# Patient Record
Sex: Male | Born: 1985 | Race: Black or African American | Hispanic: No | Marital: Married | Smoking: Current some day smoker
Health system: Southern US, Community
[De-identification: ages and names within clinical notes are randomized; demographics above are authoritative.]

## PROBLEM LIST (undated history)

## (undated) DIAGNOSIS — J45909 Unspecified asthma, uncomplicated: Secondary | ICD-10-CM

---

## 2006-01-23 ENCOUNTER — Emergency Department (HOSPITAL_COMMUNITY): Admission: EM | Admit: 2006-01-23 | Discharge: 2006-01-23 | Payer: Self-pay | Admitting: Emergency Medicine

## 2008-01-16 HISTORY — PX: WISDOM TOOTH EXTRACTION: SHX21

## 2009-02-08 ENCOUNTER — Emergency Department (HOSPITAL_COMMUNITY): Admission: EM | Admit: 2009-02-08 | Discharge: 2009-02-08 | Payer: Self-pay | Admitting: Emergency Medicine

## 2014-03-15 ENCOUNTER — Ambulatory Visit: Payer: Self-pay

## 2014-08-15 ENCOUNTER — Emergency Department (HOSPITAL_COMMUNITY): Payer: Self-pay

## 2014-08-15 ENCOUNTER — Emergency Department (HOSPITAL_COMMUNITY)
Admission: EM | Admit: 2014-08-15 | Discharge: 2014-08-15 | Discharge status: Against Medical Advice | Payer: Self-pay | Attending: Emergency Medicine | Admitting: Emergency Medicine

## 2014-08-15 ENCOUNTER — Encounter (HOSPITAL_COMMUNITY): Payer: Self-pay | Admitting: *Deleted

## 2014-08-15 DIAGNOSIS — R42 Dizziness and giddiness: Secondary | ICD-10-CM | POA: Insufficient documentation

## 2014-08-15 DIAGNOSIS — R0602 Shortness of breath: Secondary | ICD-10-CM | POA: Insufficient documentation

## 2014-08-15 DIAGNOSIS — Z720 Tobacco use: Secondary | ICD-10-CM | POA: Insufficient documentation

## 2014-08-15 DIAGNOSIS — R079 Chest pain, unspecified: Secondary | ICD-10-CM | POA: Insufficient documentation

## 2014-08-15 LAB — I-STAT TROPONIN, ED: Troponin i, poc: 0.01 ng/mL (ref 0.00–0.08)

## 2014-08-15 LAB — BASIC METABOLIC PANEL
Anion gap: 10 (ref 5–15)
BUN: 5 mg/dL — ABNORMAL LOW (ref 6–20)
CO2: 25 mmol/L (ref 22–32)
CREATININE: 0.77 mg/dL (ref 0.61–1.24)
Calcium: 9.1 mg/dL (ref 8.9–10.3)
Chloride: 103 mmol/L (ref 101–111)
GFR calc Af Amer: >60 mL/min (ref >60)
GFR calc non Af Amer: >60 mL/min (ref >60)
GLUCOSE: 97 mg/dL (ref 65–99)
Potassium: 3.3 mmol/L — ABNORMAL LOW (ref 3.5–5.1)
SODIUM: 138 mmol/L (ref 135–145)

## 2014-08-15 LAB — CBC
HCT: 43.9 % (ref 39.0–52.0)
Hemoglobin: 15.5 g/dL (ref 13.0–17.0)
MCH: 33.2 pg (ref 26.0–34.0)
MCHC: 35.3 g/dL (ref 30.0–36.0)
MCV: 94 fL (ref 78.0–100.0)
PLATELETS: 283 10*3/uL (ref 150–400)
RBC: 4.67 MIL/uL (ref 4.22–5.81)
RDW: 13.7 % (ref 11.5–15.5)
WBC: 16.1 10*3/uL — AB (ref 4.0–10.5)

## 2014-08-15 NOTE — ED Notes (Signed)
Pt called in lobby x 3. Pt not in lobby.
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
## 2014-09-03 ENCOUNTER — Emergency Department (HOSPITAL_COMMUNITY): Payer: No Typology Code available for payment source

## 2014-09-03 ENCOUNTER — Encounter (HOSPITAL_COMMUNITY): Payer: Self-pay

## 2014-09-03 ENCOUNTER — Emergency Department (HOSPITAL_COMMUNITY)
Admission: EM | Admit: 2014-09-03 | Discharge: 2014-09-03 | Disposition: A | Discharge status: Home / Self Care | Payer: No Typology Code available for payment source | Attending: Emergency Medicine | Admitting: Emergency Medicine

## 2014-09-03 DIAGNOSIS — Y998 Other external cause status: Secondary | ICD-10-CM | POA: Insufficient documentation

## 2014-09-03 DIAGNOSIS — S6991XA Unspecified injury of right wrist, hand and finger(s), initial encounter: Secondary | ICD-10-CM | POA: Insufficient documentation

## 2014-09-03 DIAGNOSIS — M25531 Pain in right wrist: Secondary | ICD-10-CM

## 2014-09-03 DIAGNOSIS — Z720 Tobacco use: Secondary | ICD-10-CM | POA: Insufficient documentation

## 2014-09-03 DIAGNOSIS — S022XXA Fracture of nasal bones, initial encounter for closed fracture: Secondary | ICD-10-CM

## 2014-09-03 DIAGNOSIS — S8991XA Unspecified injury of right lower leg, initial encounter: Secondary | ICD-10-CM | POA: Insufficient documentation

## 2014-09-03 DIAGNOSIS — Y92410 Unspecified street and highway as the place of occurrence of the external cause: Secondary | ICD-10-CM | POA: Insufficient documentation

## 2014-09-03 DIAGNOSIS — J45909 Unspecified asthma, uncomplicated: Secondary | ICD-10-CM | POA: Insufficient documentation

## 2014-09-03 DIAGNOSIS — Y9389 Activity, other specified: Secondary | ICD-10-CM | POA: Insufficient documentation

## 2014-09-03 HISTORY — DX: Unspecified asthma, uncomplicated: J45.909

## 2014-09-03 MED ORDER — METHOCARBAMOL 500 MG PO TABS: 1000.0000 mg | ORAL_TABLET | Freq: Four times a day (QID) | ORAL | Status: DC | PRN

## 2014-09-03 MED ORDER — NAPROXEN 250 MG PO TABS: 250.0000 mg | ORAL_TABLET | Freq: Two times a day (BID) | ORAL | Status: DC | PRN

## 2014-09-03 MED ORDER — HYDROCODONE-ACETAMINOPHEN 5-325 MG PO TABS: ORAL_TABLET | ORAL | Status: DC

## 2014-09-03 NOTE — Discharge Instructions (Signed)
°Emergency Department Resource Guide °1) Find a Doctor and Pay Out of Pocket °Although you won't have to find out who is covered by your insurance plan, it is a good idea to ask around and get recommendations. You will then need to call the office and see if the doctor you have chosen will accept you as a new patient and what types of options they offer for patients who are self-pay. Some doctors offer discounts or will set up payment plans for their patients who do not have insurance, but you will need to ask so you aren't surprised when you get to your appointment. ° °2) Contact Your Local Health Department °Not all health departments have doctors that can see patients for sick visits, but many do, so it is worth a call to see if yours does. If you don't know where your local health department is, you can check in your phone book. The CDC also has a tool to help you locate your state's health department, and many state websites also have listings of all of their local health departments. ° °3) Find a Walk-in Clinic °If your illness is not likely to be very severe or complicated, you may want to try a walk in clinic. These are popping up all over the country in pharmacies, drugstores, and shopping centers. They're usually staffed by nurse practitioners or physician assistants that have been trained to treat common illnesses and complaints. They're usually fairly quick and inexpensive. However, if you have serious medical issues or chronic medical problems, these are probably not your best option. ° °No Primary Care Doctor: °- Call [facility] at  832-8000 - they can help you locate a primary care doctor that  accepts your insurance, provides certain services, etc. °- Physician Referral Service- 1-800-533-3463 ° °Chronic Pain Problems: °Organization         Address  Phone   Notes  °[facility]  (336) 297-2271 Patients need to be referred by their primary care doctor.  ° °Medication  Assistance: °Organization         Address  Phone   Notes  °[place] [facility] [address] °[place], [place] [address] (336) 641-8030 --Must be a resident of [place] °-- Must have NO insurance coverage whatsoever (no Medicaid/ Medicare, etc.) °-- The pt. MUST have a primary care doctor that directs their care regularly and follows them in the community °  °[facility]  (866) 331-1348   °[employer]  (888) 892-1162   ° °Agencies that provide inexpensive medical care: °Organization         Address  Phone   Notes  °[facility]  (336) 832-8035   °[facility]    (336) 832-7272   °[facility] [address] °[place], [place] [address] (336) 832-4777   °[facility] [address], °[place] (336) 271-4999   °[facility]    (336) 373-0678   °[facility]    (336) 272-1050   °[facility] ° [address], [place] Phone:  (336) 832-4444, Fax:  (336) 832-4440 Hours of Operation:  9 am - 6 pm, M-F.  Also accepts Medicaid/Medicare and self-pay.  °[facility] ° [address], [place] Phone: (336) 832-3150, Fax: (336) 832-3151. Hours of Operation:  8:30 am - 5:30 pm, M-F.  Also accepts Medicaid and self-pay.  °[facility] [place] [address], [place] Phone: (336) 878-6027   °[facility] [address], [place], [place] (336)723-1848, Ext. 123 Mondays & Thursdays: 7-9 AM.  First 15 patients are seen on a first come, first serve basis. °  ° °Medicaid-accepting [place] Providers: ° °Organization         Address  Phone   Notes  °[facility] [address], [place] (336) 641-2100 Also accepts self-pay patients.  °[facility] [address], [place] ° (336) 856-9996   °[facility] [address], [place]  (336) 288-8857   °[facility] [address] [place] [address], [place] (336) 299-7000   °Veita Bland [address], [place]  ° (336) 373-1557 Only accepts [place] Access Medicaid patients after they have their name applied to their card.  ° °Self-Pay (no insurance) in [place]: ° °Organization         Address  Phone   Notes  °Sickle Cell Patients, [facility] [address], [place] (336) 832-1970   °[facility] [address], [place] (336) 832-4400   °[facility] ° [address], [place] (336) 992-4800   °[facility]/Dr. Osei-Bonsu ° [address] [place] [address], [place] or [address], [place] (336) 841-8500 Phone number for both [place] and [place] locations is the same.  °[facility] [address], [place] (336) 299-0000   °[facility] [address] [place] [address], [place] or [address] (336) 852-7530 °(336) 878-2260   °[facility] [address], [place] (336) 350-1642, phone; (336) 294-5005, fax Sees patients 1st and 3rd Saturday of every month.  Must not qualify for public or private insurance (i.e. Medicaid, Medicare, [place] Health Choice, Veterans' Benefits) • Household income should be no more than 200% of the poverty level •The clinic cannot treat you if you are pregnant or think you are pregnant • Sexually transmitted diseases are not treated at the clinic.  ° ° °Dental Care: °Organization         Address  Phone  Notes  °[place] [facility] [address], [place] (336) 641-6152 Accepts children up to age 21 who are enrolled in Medicaid or [place] Health Choice; pregnant women with a Medicaid card; and children who have applied for Medicaid or [place] Health Choice, but were declined, whose parents can pay a reduced fee at time of service.  °[place]  [facility] [place]  [address], [place] (336) 641-7733 Accepts children up to age 21 who are enrolled in Medicaid or [place] Health Choice; pregnant women with a Medicaid card; and children who have applied for Medicaid or [place] Health Choice, but were declined, whose parents can pay a reduced fee at time of service.  °[facility] ° [address], [place] (336) 641-4533 Patients are seen by appointment only. Walk-ins are not accepted. [facility] will see patients 18 years of age and older. °Monday - Tuesday (8am-5pm) °Most Wednesdays (8:30-5pm) °$30 per visit, cash only  °[facility] ° [address], [place] (336) 641-4533 Patients are seen by appointment only. Walk-ins are not accepted. [facility] will see patients 18 years of age and older. °One   Wednesday Evening (Monthly: Volunteer Based).  $30 per visit, cash only  °[facility]  (919) 537-3737 for adults; Children under age 4, call Graduate Pediatric Dentistry at ([phone]) [phone]. Children aged 4-14, please call (919) [phone] to request a pediatric application. ° Dental services are provided in all areas of dental care including fillings, crowns and bridges, complete and partial dentures, implants, gum treatment, root canals, and extractions. Preventive care is also provided. Treatment is provided to both adults and children. °Patients are selected via a lottery and there is often a waiting list. °  °[facility] [address], °[place] ° (336) 763-8833 [email or web address] °  °[facility] [address], [place], [place] (336)723-1848, Ext. 123 Second and Fourth Thursday of each month, opens at 6:30 AM; Clinic ends at 9 AM.  Patients are seen on a first-come first-served basis, and a limited number are seen during each clinic.  ° °[facility] ° [address], [place], [place] (336) 723-7904    Eligibility Requirements °You must have lived in [place], [place], or [place] counties for at least the last three months. °  You cannot be eligible for state or federal sponsored healthcare insurance, including Veterans Administration, Medicaid, or Medicare. °  You generally cannot be eligible for healthcare insurance through your employer.  °  How to apply: °Eligibility screenings are held every Tuesday and Wednesday afternoon from 1:00 pm until 4:00 pm. You do not need an appointment for the interview!  °[facility] [address], [place], [place] 336-631-2330   °[facility]  336-342-8273   °[facility]  336-703-3100   °[facility]  336-570-6415   ° °Behavioral Health Resources in the Community: °Intensive Outpatient Programs °Organization         Address  Phone  Notes  °[facility] [address], [place], [place] 336-878-6098   °[facility] [address], [place], [place] 336-832-9800   °[facility] [address], [place], [place] ° 336-882-2125   °[facility] [address],  °[place], [place] 1-800-853-5163 or 336-641-4981   °Substance Abuse Resources °Organization         Address  Phone  Notes  °[facility]  336-882-2125   °[facility]  336-784-9470   °[facility]  336-285-9073   °[facility]  336-845-3988   °Residential & Outpatient Substance Abuse Program  1-800-659-3381   °Psychological Services °Organization         Address  Phone  Notes  °[facility]  336- 832-9600   °[facility]  336- 378-7881   °[facility] [address], [place] 1-800-853-5163 or 336-641-4981   ° °Mobile Crisis Teams °Organization         Address  Phone  Notes  °[facility], Mobile Crisis Care Unit  1-877-626-1772   °Assertive °[facility] ° [address]  [place], [place] 336-834-9664   °Sharon DeEsch [address] °[place] 336-554-5454   ° °Self-Help/Support Groups °Organization         Address  Phone             Notes  °[facility]  - variety of support groups  336- 373-1402 Call for more information  °Narcotics Anonymous (NA), [facility] [address], °[place]  2 meetings at this location  ° °  Residential Treatment Programs Organization         Address  Phone  Notes  [facility] [address],    [place]  1-610-960-4540   [facility]  [address] [place] [address], [place], [place] 478-295-6213   [facility] [address], [place] 086-578-4696 Admissions: 8am-3pm M-F  [facility] [address],    [place], [place] 295-284-1324   [facility] [address] [place], [place], [place] 401-027-2536   [facility] [address]  [place], [place] 644-034-7425   [facility] Intensive Outpatient [address], [place], [place] 956-387-5643   [facility] ([facility] [address]  [place], [place] 3-295-188-4166 or 743-485-3949   [facility] ([facility] [address], [place], [place] 323-557-3220 Accepts Medicaid  [facility] [address]  [place] 2-542-706-2376 Substance Abuse/Addiction Treatment   [facility] Organization         Address  Phone  Notes  [facility]  720-154-9306   Angie Fava, PhD [address] [place], [place]   613-220-5051 or 831-807-1257   [facility]   [address] [place], [place] (949)097-2024   [facility] [address], [place], [place] 617-321-9936 Insurance/Medicaid/sponsorship through [facility] [address]                                    [place], [place] 337-545-4670 Therapy/tele-psych/case    [facility] [address], [place] 850-683-9379    Dr. Lolly Mustache  (939) 535-1455   [facility]  [employer] [place] [facility] 1) [address], [place] 2) [address], [place] 3)  [address], [place] (334) [phone] [phone]  727-026-7250   [facility] Child Abuse Hotline 516-467-9530 or 505-683-5837 (After Hours)      Take the prescriptions as directed.  Apply moist heat or ice to the area(s) of discomfort, for 15 minutes at a time, several times per day for the next few days.  Do not fall asleep on a heating or ice pack.  Call the Orthopedic doctor today to schedule a follow up appointment next week.  Call the ENT doctor today to schedule a follow up appointment within the next 2 weeks. Return to the Emergency Department immediately if worsening.
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
## 2015-12-13 ENCOUNTER — Ambulatory Visit (INDEPENDENT_AMBULATORY_CARE_PROVIDER_SITE_OTHER): Payer: Self-pay

## 2015-12-13 ENCOUNTER — Encounter (HOSPITAL_COMMUNITY): Payer: Self-pay | Admitting: Emergency Medicine

## 2015-12-13 ENCOUNTER — Ambulatory Visit (HOSPITAL_COMMUNITY)
Admission: EM | Admit: 2015-12-13 | Discharge: 2015-12-13 | Disposition: A | Discharge status: Home / Self Care | Payer: Self-pay | Attending: Family Medicine | Admitting: Family Medicine

## 2015-12-13 DIAGNOSIS — J069 Acute upper respiratory infection, unspecified: Secondary | ICD-10-CM

## 2015-12-13 NOTE — ED Triage Notes (Signed)
[facility] with a complaint of a fever that has been recurrent for 2 weeks and he also reported recurrent nose bleeds that occurred today as well.

## 2015-12-13 NOTE — Discharge Instructions (Signed)
Drink plenty of fluids as discussed, use tylenol or advil for fever and mucinex or delsym for cough. Return or see your doctor if further problems °

## 2015-12-13 NOTE — ED Provider Notes (Signed)
[facility]    CSN: [id number] Arrival date & time: 12/13/15  1731     History   Chief Complaint Chief Complaint  Patient presents with  . Fever    HPI David Hunter is a 30 y.o. male.   The history is provided by the patient.  Fever  Temp source:  Subjective Severity:  Moderate Onset quality:  Sudden Duration:  2 weeks Progression:  Waxing and waning Chronicity:  Recurrent Relieved by:  None tried Worsened by:  Nothing Ineffective treatments:  None tried Associated symptoms: diarrhea   Associated symptoms: no chest pain, no headaches, no nausea, no rash and no vomiting     Past Medical History:  Diagnosis Date  . Asthma     There are no active problems to display for this patient.   History reviewed. No pertinent surgical history.     Home Medications    Prior to Admission medications   Medication Sig Start Date End Date Taking? Authorizing Provider  HYDROcodone-acetaminophen (NORCO/VICODIN) 5-325 MG per tablet 1 or 2 tabs PO q6 hours prn pain 09/03/14   Samuel JesterKathleen McManus, DO  methocarbamol (ROBAXIN) 500 MG tablet Take 2 tablets (1,000 mg total) by mouth 4 (four) times daily as needed for muscle spasms (muscle spasm/pain). 09/03/14   Samuel JesterKathleen McManus, DO  naproxen (NAPROSYN) 250 MG tablet Take 1 tablet (250 mg total) by mouth 2 (two) times daily as needed for mild pain or moderate pain (take with food). 09/03/14   Samuel JesterKathleen McManus, DO    Family History History reviewed. No pertinent family history.  Social History Social History  Substance Use Topics  . Smoking status: Current Every Day Smoker  . Smokeless tobacco: Never Used  . Alcohol use Yes     Comment: weekends     Allergies   Patient has no known allergies.   Review of Systems Review of Systems  Constitutional: Positive for fever.  Cardiovascular: Negative for chest pain.  Gastrointestinal: Positive for diarrhea. Negative for nausea and vomiting.  Skin: Negative for rash.    Neurological: Negative for headaches.     Physical Exam Triage Vital Signs ED Triage Vitals  Enc Vitals Group     BP 12/13/15 1816 161/75     Pulse Rate 12/13/15 1816 95     Resp 12/13/15 1816 16     Temp 12/13/15 1816 99.3 F (37.4 C)     Temp Source 12/13/15 1816 Oral     SpO2 12/13/15 1816 100 %     Weight --      Height --      Head Circumference --      Peak Flow --      Pain Score 12/13/15 1819 2     Pain Loc --      Pain Edu? --      Excl. in GC? --    No data found.   Updated Vital Signs BP 161/75 (BP Location: Left Arm)   Pulse 95   Temp 99.3 F (37.4 C) (Oral)   Resp 16   SpO2 100%   Visual Acuity Right Eye Distance:   Left Eye Distance:   Bilateral Distance:    Right Eye Near:   Left Eye Near:    Bilateral Near:     Physical Exam  Constitutional: He is oriented to person, place, and time. He appears well-developed and well-nourished. No distress.  HENT:  Right Ear: External ear normal.  Left Ear: External ear normal.  Nose: Nose  normal.  Mouth/Throat: Oropharynx is clear and moist.  Eyes: Pupils are equal, round, and reactive to light.  Neck: Normal range of motion. Neck supple.  Cardiovascular: Normal rate, regular rhythm, normal heart sounds and intact distal pulses.   Pulmonary/Chest: Effort normal and breath sounds normal.  Abdominal: Soft. Bowel sounds are normal. There is no tenderness.  Lymphadenopathy:    He has no cervical adenopathy.  Neurological: He is alert and oriented to person, place, and time.  Skin: Skin is warm and dry.  Nursing note and vitals reviewed.    UC Treatments / Results  Labs (all labs ordered are listed, but only abnormal results are displayed) Labs Reviewed - No data to display  EKG  EKG Interpretation None       Radiology No results found. X-rays reviewed and report per radiologist.  Procedures Procedures (including critical care time)  Medications Ordered in UC Medications - No data to  display   Initial Impression / Assessment and Plan / UC Course  I have reviewed the triage vital signs and the nursing notes.  Pertinent labs & imaging results that were available during my care of the patient were reviewed by me and considered in my medical decision making (see chart for details).  Clinical Course       Final Clinical Impressions(s) / UC Diagnoses   Final diagnoses:  None    New Prescriptions New Prescriptions   No medications on file     Linna HoffJames D Kindl, MD 12/13/15 1919

## 2016-03-22 ENCOUNTER — Ambulatory Visit (HOSPITAL_COMMUNITY)
Admission: EM | Admit: 2016-03-22 | Discharge: 2016-03-22 | Disposition: A | Discharge status: Home / Self Care | Payer: Self-pay | Attending: Internal Medicine | Admitting: Internal Medicine

## 2016-03-22 ENCOUNTER — Encounter (HOSPITAL_COMMUNITY): Payer: Self-pay | Admitting: Emergency Medicine

## 2016-03-22 DIAGNOSIS — Z833 Family history of diabetes mellitus: Secondary | ICD-10-CM | POA: Insufficient documentation

## 2016-03-22 DIAGNOSIS — Z202 Contact with and (suspected) exposure to infections with a predominantly sexual mode of transmission: Secondary | ICD-10-CM | POA: Insufficient documentation

## 2016-03-22 DIAGNOSIS — Z8249 Family history of ischemic heart disease and other diseases of the circulatory system: Secondary | ICD-10-CM | POA: Insufficient documentation

## 2016-03-22 DIAGNOSIS — F17200 Nicotine dependence, unspecified, uncomplicated: Secondary | ICD-10-CM | POA: Insufficient documentation

## 2016-03-22 DIAGNOSIS — R300 Dysuria: Secondary | ICD-10-CM | POA: Insufficient documentation

## 2016-03-22 LAB — POCT URINALYSIS DIP (DEVICE)
Bilirubin Urine: NEGATIVE
Glucose, UA: NEGATIVE mg/dL
Hgb urine dipstick: NEGATIVE
Ketones, ur: NEGATIVE mg/dL
LEUKOCYTES UA: NEGATIVE
Nitrite: NEGATIVE
Protein, ur: NEGATIVE mg/dL
Specific Gravity, Urine: 1.015 (ref 1.005–1.030)
UROBILINOGEN UA: 0.2 mg/dL (ref 0.0–1.0)
pH: 8.5 — ABNORMAL HIGH (ref 5.0–8.0)

## 2016-03-22 MED ORDER — AZITHROMYCIN 250 MG PO TABS
1000.0000 mg | ORAL_TABLET | Freq: Once | ORAL | Status: AC
  Administered 2016-03-22: 1000 mg via ORAL

## 2016-03-22 MED ORDER — CEFTRIAXONE SODIUM 250 MG IJ SOLR
INTRAMUSCULAR | Status: AC
  Filled 2016-03-22: qty 250

## 2016-03-22 MED ORDER — AZITHROMYCIN 250 MG PO TABS
ORAL_TABLET | ORAL | Status: AC
  Filled 2016-03-22: qty 4

## 2016-03-22 MED ORDER — CEFTRIAXONE SODIUM 250 MG IJ SOLR
250.0000 mg | Freq: Once | INTRAMUSCULAR | Status: AC
  Administered 2016-03-22: 250 mg via INTRAMUSCULAR

## 2016-03-22 NOTE — ED Provider Notes (Signed)
CSN: [id number]     Arrival date & time 03/22/16  1728 History   None    Chief Complaint  Patient presents with  . Exposure to STD   (Consider location/radiation/quality/duration/timing/severity/associated sxs/prior Treatment) 31 year old male presents to clinic with concerns over possible STD. States she's never been screened for STDs before, however he's had about a 6 month history of rash, scrotal swelling, painful, burning urination, and concern for some swollen lymph nodes. He has had multiple partners, he reports intermittent condom use.  He denies any weakness, dizziness, fatigue, has had loss of appetite, denies muscle aches, bodyaches, fever, or chills. He reports he has had some nausea, but no vomiting, diarrhea, abdominal pain, or flank pain he reports he's had a rash on his penis, but no rashes or lesions elsewhere on his skin, he has no heat or cold intolerance, and denies other complaints. He reports he is had a past history of marijuana use, but has since stopped. He does report occasionally smoking and drinking. Family history is significant for hypertension, cardiac disease, diabetes.   The history is provided by the patient.  Exposure to STD     Past Medical History:  Diagnosis Date  . Asthma    History reviewed. No pertinent surgical history. History reviewed. No pertinent family history. Social History  Substance Use Topics  . Smoking status: Current Every Day Smoker  . Smokeless tobacco: Never Used  . Alcohol use Yes     Comment: weekends    Review of Systems  Reason unable to perform ROS: As covered in history of present illness.  All other systems reviewed and are negative.   Allergies  Patient has no known allergies.  Home Medications   Prior to Admission medications   Medication Sig Start Date End Date Taking? Authorizing Provider  HYDROcodone-acetaminophen (NORCO/VICODIN) 5-325 MG per tablet 1 or 2 tabs PO q6 hours prn pain 09/03/14   Samuel JesterKathleen  McManus, DO  methocarbamol (ROBAXIN) 500 MG tablet Take 2 tablets (1,000 mg total) by mouth 4 (four) times daily as needed for muscle spasms (muscle spasm/pain). 09/03/14   Samuel JesterKathleen McManus, DO  naproxen (NAPROSYN) 250 MG tablet Take 1 tablet (250 mg total) by mouth 2 (two) times daily as needed for mild pain or moderate pain (take with food). 09/03/14   Samuel JesterKathleen McManus, DO   Meds Ordered and Administered this Visit   Medications  cefTRIAXone (ROCEPHIN) injection 250 mg (not administered)  azithromycin (ZITHROMAX) tablet 1,000 mg (not administered)    BP 161/91 (BP Location: Right Arm)   Pulse 78   Temp 98.5 F (36.9 C) (Oral)   Resp 18   SpO2 100%  No data found.   Physical Exam  Constitutional: He is oriented to person, place, and time. He appears well-developed and well-nourished. No distress.  HENT:  Head: Normocephalic and atraumatic.  Right Ear: External ear normal.  Left Ear: External ear normal.  Eyes: Pupils are equal, round, and reactive to light. Right eye exhibits no discharge. Left eye exhibits no discharge.  Neck: Normal range of motion. Neck supple. No JVD present.  Cardiovascular: Normal rate and regular rhythm.   Pulmonary/Chest: Effort normal and breath sounds normal. No respiratory distress. He has no wheezes.  Abdominal: Soft. Bowel sounds are normal. He exhibits no distension. There is no tenderness. There is no guarding. Hernia confirmed negative in the right inguinal area and confirmed negative in the left inguinal area.  Genitourinary: Cremasteric reflex is present. Right testis shows no mass, no  swelling and no tenderness. Right testis is descended. Cremasteric reflex is not absent on the right side. Left testis shows swelling and tenderness. Left testis shows no mass. Left testis is descended. Cremasteric reflex is not absent on the left side. Circumcised. Penile tenderness present. No phimosis, paraphimosis, hypospadias or penile erythema. No discharge found.   Musculoskeletal: He exhibits no edema.  Lymphadenopathy:       Head (right side): No submental, no submandibular, no tonsillar and no preauricular adenopathy present.       Head (left side): No submental, no submandibular, no tonsillar and no preauricular adenopathy present.    He has no cervical adenopathy. No inguinal adenopathy noted on the right or left side.  Neurological: He is alert and oriented to person, place, and time.  Skin: Skin is warm and dry. Capillary refill takes less than 2 seconds. He is not diaphoretic.  Psychiatric: He has a normal mood and affect. His behavior is normal.  Nursing note and vitals reviewed.   Urgent Care Course     Procedures (including critical care time)  Labs Review Labs Reviewed  POCT URINALYSIS DIP (DEVICE) - Abnormal; Notable for the following:       Result Value   pH 8.5 (*)    All other components within normal limits  HIV ANTIBODY (ROUTINE TESTING)  RPR  URINE CYTOLOGY ANCILLARY ONLY    Imaging Review No results found.      MDM   1. Possible exposure to STD   2. Dysuria    You have been screened for multiple types of infection diseases such as Gonorrhea, Chlamydia. You have also been screened for HIV and Syphilis. You will be notified of the results of these tests in 24-72 hours. If any therapy needs to be started or if your current therapy needs to be changed, you will be notified and and given instructions as to what to do.   Based on your symptoms and findings on physical exam, you are being treated for an infection. You have received an injection of Rocephin, 250 mg and a tablet of Azithromycin, 1 gram. I would recommend following up with your primary care provider, the health department, or return to clinic in 1 week for re-screening to ensure clearance of the infection.   If your symptoms persist or fail to resolve, follow up with your primary care provider or return to clinic.       Dorena Bodo, NP 03/22/16  [phone]

## 2016-03-22 NOTE — ED Triage Notes (Signed)
Here for rash on groin onset 1 month associated w/swelling of the lymph nodes on bilateral inguinal area, and dysuria  Reports he sexually active w/mult partners and does not use condoms  Denies fevers, penile d/c  A&O x4... NAD

## 2016-03-22 NOTE — Discharge Instructions (Signed)
You have been screened for multiple types of infection diseases such as Gonorrhea, Chlamydia. You have also been screened for HIV and Syphilis. You will be notified of the results of these tests in 24-72 hours. If any therapy needs to be started or if your current therapy needs to be changed, you will be notified and and given instructions as to what to do.   Based on your symptoms and findings on physical exam, you are being treated for an infection. You have received an injection of Rocephin, 250 mg and a tablet of Azithromycin, 1 gram. I would recommend following up with your primary care provider, the health department, or return to clinic in 1 week for re-screening to ensure clearance of the infection.   If your symptoms persist or fail to resolve, follow up with your primary care provider or return to clinic.

## 2016-03-23 LAB — HIV ANTIBODY (ROUTINE TESTING W REFLEX): HIV Screen 4th Generation wRfx: NONREACTIVE

## 2016-03-23 LAB — RPR: RPR Ser Ql: NONREACTIVE

## 2016-03-26 LAB — URINE CYTOLOGY ANCILLARY ONLY
Chlamydia: POSITIVE — AB
Neisseria Gonorrhea: NEGATIVE

## 2016-06-06 ENCOUNTER — Ambulatory Visit (HOSPITAL_COMMUNITY)
Admission: EM | Admit: 2016-06-06 | Discharge: 2016-06-06 | Disposition: A | Discharge status: Home / Self Care | Payer: Self-pay | Attending: Family Medicine | Admitting: Family Medicine

## 2016-06-06 ENCOUNTER — Encounter (HOSPITAL_COMMUNITY): Payer: Self-pay | Admitting: Emergency Medicine

## 2016-06-06 DIAGNOSIS — Z202 Contact with and (suspected) exposure to infections with a predominantly sexual mode of transmission: Secondary | ICD-10-CM | POA: Insufficient documentation

## 2016-06-06 DIAGNOSIS — Z7689 Persons encountering health services in other specified circumstances: Secondary | ICD-10-CM

## 2016-06-06 DIAGNOSIS — F17200 Nicotine dependence, unspecified, uncomplicated: Secondary | ICD-10-CM | POA: Insufficient documentation

## 2016-06-06 DIAGNOSIS — R300 Dysuria: Secondary | ICD-10-CM | POA: Insufficient documentation

## 2016-06-06 LAB — POCT URINALYSIS DIP (DEVICE)
Bilirubin Urine: NEGATIVE
GLUCOSE, UA: NEGATIVE mg/dL
Ketones, ur: NEGATIVE mg/dL
LEUKOCYTES UA: NEGATIVE
NITRITE: NEGATIVE
Protein, ur: 30 mg/dL — AB
Specific Gravity, Urine: 1.015 (ref 1.005–1.030)
UROBILINOGEN UA: 0.2 mg/dL (ref 0.0–1.0)
pH: 7 (ref 5.0–8.0)

## 2016-06-06 MED ORDER — STERILE WATER FOR INJECTION IJ SOLN
INTRAMUSCULAR | Status: AC
  Filled 2016-06-06: qty 10

## 2016-06-06 MED ORDER — CEFTRIAXONE SODIUM 250 MG IJ SOLR
250.0000 mg | Freq: Once | INTRAMUSCULAR | Status: AC
  Administered 2016-06-06: 250 mg via INTRAMUSCULAR

## 2016-06-06 MED ORDER — AZITHROMYCIN 250 MG PO TABS
1000.0000 mg | ORAL_TABLET | Freq: Once | ORAL | Status: AC
  Administered 2016-06-06: 1000 mg via ORAL

## 2016-06-06 MED ORDER — CEFTRIAXONE SODIUM 250 MG IJ SOLR
INTRAMUSCULAR | Status: AC
Start: 2016-06-06 — End: 2016-06-06
  Filled 2016-06-06: qty 250

## 2016-06-06 MED ORDER — AZITHROMYCIN 250 MG PO TABS
ORAL_TABLET | ORAL | Status: AC
  Filled 2016-06-06: qty 4

## 2016-06-06 NOTE — ED Provider Notes (Signed)
CSN: [id number]     Arrival date & time 06/06/16  1501 History   None    Chief Complaint  Patient presents with  . SEXUALLY TRANSMITTED DISEASE   (Consider location/radiation/quality/duration/timing/severity/associated sxs/prior Treatment) 31 year old male states his girlfriend was recently diagnosed with chlamydia. He states he was exposed to her 2 days ago. He is complaining of burning with urination. Denies penile discharge. Denies genital lesions. He states a few days ago his left testicle swollen for a very short period of time but feels well now.      Past Medical History:  Diagnosis Date  . Asthma    History reviewed. No pertinent surgical history. History reviewed. No pertinent family history. Social History  Substance Use Topics  . Smoking status: Current Some Day Smoker  . Smokeless tobacco: Never Used  . Alcohol use No    Review of Systems  Constitutional: Negative.   Gastrointestinal: Negative.   Genitourinary: Positive for dysuria. Negative for discharge, flank pain, frequency, genital sores, penile pain, penile swelling, scrotal swelling, testicular pain and urgency.  Neurological: Negative.   All other systems reviewed and are negative.   Allergies  Patient has no known allergies.  Home Medications   Prior to Admission medications   Medication Sig Start Date End Date Taking? Authorizing Provider  HYDROcodone-acetaminophen (NORCO/VICODIN) 5-325 MG per tablet 1 or 2 tabs PO q6 hours prn pain 09/03/14   Samuel Jester, DO   Meds Ordered and Administered this Visit  Medications - No data to display  BP 133/78 (BP Location: Left Arm)   Pulse 82   Temp 98.3 F (36.8 C) (Oral)   SpO2 99%  No data found.   Physical Exam  Constitutional: He is oriented to person, place, and time. He appears well-developed and well-nourished. No distress.  Eyes: EOM are normal.  Neck: Neck supple.  Cardiovascular: Normal rate.   Pulmonary/Chest: Effort normal. No  respiratory distress.  Genitourinary: Penis normal.  Genitourinary Comments: Normal external male genitalia. There is no testicular swelling or tenderness. No scrotal swelling. No penile discharge. No lesions to the genitals. No tenderness to the epididymal apparatus. Normal exam.  Musculoskeletal: He exhibits no edema.  Neurological: He is alert and oriented to person, place, and time. He exhibits normal muscle tone.  Skin: Skin is warm and dry.  Psychiatric: He has a normal mood and affect.  Nursing note and vitals reviewed.   Urgent Care Course     Procedures (including critical care time)  Labs Review Labs Reviewed  POCT URINALYSIS DIP (DEVICE) - Abnormal; Notable for the following:       Result Value   Hgb urine dipstick TRACE (*)    Protein, ur 30 (*)    All other components within normal limits  URINE CYTOLOGY ANCILLARY ONLY    Imaging Review No results found.   Visual Acuity Review  Right Eye Distance:   Left Eye Distance:   Bilateral Distance:    Right Eye Near:   Left Eye Near:    Bilateral Near:         MDM   1. Encounter for assessment of STD exposure   2. Dysuria    You are being given medications today to treat chlamydia and gonorrhea. Should urine urine tests come back positive for other infectious organisms we will call you and likely be able to treat this over the phone. Meds ordered this encounter  Medications  . cefTRIAXone (ROCEPHIN) injection 250 mg  . azithromycin (ZITHROMAX) tablet 1,000  mg        Hayden RasmussenMabe, Jersi Mcmaster, NP 06/06/16 1558

## 2016-06-06 NOTE — Discharge Instructions (Signed)
You are being given medications today to treat chlamydia and gonorrhea. Should urine urine tests come back positive for other infectious organisms we will call you and likely be able to treat this over the phone.

## 2016-06-06 NOTE — ED Triage Notes (Signed)
Pt has a partner that tested positive for chlamydia.  Pt states he has been having symptoms of his own for one month.  Pt reports burning with urination.

## 2016-06-07 LAB — URINE CYTOLOGY ANCILLARY ONLY
CHLAMYDIA, DNA PROBE: NEGATIVE
Neisseria Gonorrhea: NEGATIVE
Trichomonas: NEGATIVE

## 2016-06-13 LAB — URINE CYTOLOGY ANCILLARY ONLY: BACTERIAL VAGINITIS: POSITIVE — AB

## 2016-08-15 ENCOUNTER — Encounter (HOSPITAL_COMMUNITY): Payer: Self-pay | Admitting: Emergency Medicine

## 2016-08-15 ENCOUNTER — Ambulatory Visit (HOSPITAL_COMMUNITY)
Admission: EM | Admit: 2016-08-15 | Discharge: 2016-08-15 | Disposition: A | Discharge status: Home / Self Care | Payer: Self-pay | Attending: Internal Medicine | Admitting: Internal Medicine

## 2016-08-15 ENCOUNTER — Ambulatory Visit (INDEPENDENT_AMBULATORY_CARE_PROVIDER_SITE_OTHER): Payer: Self-pay

## 2016-08-15 DIAGNOSIS — S6702XA Crushing injury of left thumb, initial encounter: Secondary | ICD-10-CM

## 2016-08-15 MED ORDER — HYDROCODONE-ACETAMINOPHEN 5-325 MG PO TABS
1.0000 | ORAL_TABLET | Freq: Once | ORAL | Status: AC
  Administered 2016-08-15: 1 via ORAL

## 2016-08-15 MED ORDER — HYDROCODONE-ACETAMINOPHEN 5-325 MG PO TABS: 1.0000 | ORAL_TABLET | ORAL | 0 refills | Status: DC | PRN

## 2016-08-15 MED ORDER — HYDROCODONE-ACETAMINOPHEN 5-325 MG PO TABS
ORAL_TABLET | ORAL | Status: AC
  Filled 2016-08-15: qty 1

## 2016-08-15 MED ORDER — LIDOCAINE HCL 2 % IJ SOLN
INTRAMUSCULAR | Status: AC
  Filled 2016-08-15: qty 20

## 2016-08-15 NOTE — ED Triage Notes (Signed)
The [facility] with a complaint of an injury to the thumb on his left hand that occurred today when it got crushed with a hydraulic lift on a [employer] truck.

## 2016-08-15 NOTE — ED Provider Notes (Signed)
CSN: [id number]     Arrival date & time 08/15/16  1511 History   None    Chief Complaint  Patient presents with  . Trauma   (Consider location/radiation/quality/duration/timing/severity/associated sxs/prior Treatment) Patient smashed his left thumb on hydraulic lift and has swelling, bleeding, and nail bed pain.   The history is provided by the patient.  Trauma  Mechanism of injury comment:  Crush injury Injury location:  Finger Finger injury location:  L thumb Incident location:  Around machinery Time since incident:  30 minutes Arrived directly from scene: yes   Suspicion of alcohol use: no   Suspicion of drug use: no   Tetanus status:  Up to date Prior to arrival data:    Bystander interventions:  None   Patient ambulatory at scene: yes     Blood loss:  Minimal   Responsiveness at scene:  Alert   Orientation at scene:  Person, place and situation   Loss of consciousness: no     Past Medical History:  Diagnosis Date  . Asthma    History reviewed. No pertinent surgical history. History reviewed. No pertinent family history. Social History  Substance Use Topics  . Smoking status: Current Some Day Smoker  . Smokeless tobacco: Never Used  . Alcohol use No    Review of Systems  Constitutional: Negative.   HENT: Negative.   Eyes: Negative.   Respiratory: Negative.   Cardiovascular: Negative.   Gastrointestinal: Negative.   Endocrine: Negative.   Genitourinary: Negative.   Musculoskeletal: Positive for arthralgias.  Allergic/Immunologic: Negative.   Neurological: Negative.   Hematological: Negative.   Psychiatric/Behavioral: Negative.     Allergies  Patient has no known allergies.  Home Medications   Prior to Admission medications   Medication Sig Start Date End Date Taking? Authorizing Provider  HYDROcodone-acetaminophen (NORCO/VICODIN) 5-325 MG tablet Take 1-2 tablets by mouth every 4 (four) hours as needed. 08/15/16   Deatra Canterxford, Lilya Smitherman J, FNP   Meds  Ordered and Administered this Visit   Medications  HYDROcodone-acetaminophen (NORCO/VICODIN) 5-325 MG per tablet 1 tablet (1 tablet Oral Given 08/15/16 1602)    BP (!) 142/81 (BP Location: Right Arm)   Pulse 79   Temp 98 F (36.7 C) (Oral)   Resp 20   SpO2 100%  No data found.   Physical Exam  Constitutional: He appears well-developed and well-nourished.  HENT:  Head: Normocephalic and atraumatic.  Eyes: Pupils are equal, round, and reactive to light. Conjunctivae and EOM are normal.  Neck: Normal range of motion. Neck supple.  Cardiovascular: Normal rate, regular rhythm and normal heart sounds.   Pulmonary/Chest: Effort normal and breath sounds normal.  Musculoskeletal: He exhibits tenderness.  Left thumb with smashed thumb nail and avulsed edges with bleeding the nail has come out proximally and patient can feel sensation distal thumb.  Neurovascular intact.  Nursing note and vitals reviewed.   Urgent Care Course     Wound repair Date/Time: 08/15/2016 5:30 PM Performed by: Deatra CanterXFORD, Arlette Schaad J Authorized by: Deatra CanterXFORD, Mitsy Owen J  Consent: Verbal consent obtained. Consent given by: patient Patient understanding: patient states understanding of the procedure being performed Patient consent: the patient's understanding of the procedure matches consent given Procedure consent: procedure consent matches procedure scheduled Time out: Immediately prior to procedure a "time out" was called to verify the correct patient, procedure, equipment, support staff and site/side marked as required. Preparation: Patient was prepped and draped in the usual sterile fashion. Local anesthesia used: yes Anesthesia: digital block  Anesthesia:  Local anesthesia used: yes Local Anesthetic: lidocaine 1% without epinephrine Anesthetic total: 5 mL  Sedation: Patient sedated: no Patient tolerance: Patient tolerated the procedure well with no immediate complications Comments: Left thumb is irrigated with  150 ml Saline.  Non adaptic dressing applied and then 4x4 dressing taped secure.    (including critical care time)  Labs Review Labs Reviewed - No data to display  Imaging Review Dg Finger Thumb Left  Result Date: 08/15/2016 CLINICAL DATA:  Crush injury of the left thumb today in a hydropic press. EXAM: LEFT THUMB 2+V COMPARISON:  None in PACs FINDINGS: There is a transversely oriented tuft fracture of the distal phalanx. Alignment remains near anatomic. There is disruption of the overlying soft tissues and nail bed with soft tissue gas present. The IP joint is unremarkable. The proximal phalanx is normal. The first metacarpal appears normal. IMPRESSION: There is an open minimally distracted fracture of the tuft of the distal phalanx of the left thumb. Electronically Signed   By: David  SwazilandJordan M.D.   On: 08/15/2016 15:49     Visual Acuity Review  Right Eye Distance:   Left Eye Distance:   Bilateral Distance:    Right Eye Near:   Left Eye Near:    Bilateral Near:         MDM   1. Crushing injury of left thumb, initial encounter    Dr. Orlan Leavensrtman called and recommends irrigation of the left thumb dressing and will see patient tomorrow in his office.  Norco 5/325 given in office Norco 5/325 one to two po q 6 hours prn pain #6  Explained to patient to go to Dr. Bari Edwardrtman's office in am when opens up.  Dressing left thumb placed.     Deatra CanterOxford, Veatrice Eckstein J, [place]FNP 08/15/16 1732

## 2016-08-17 ENCOUNTER — Encounter (HOSPITAL_COMMUNITY): Payer: Self-pay

## 2016-08-17 ENCOUNTER — Ambulatory Visit (HOSPITAL_COMMUNITY): Payer: No Typology Code available for payment source | Admitting: Certified Registered Nurse Anesthetist

## 2016-08-17 ENCOUNTER — Encounter (HOSPITAL_COMMUNITY)
Admission: RE | Disposition: A | Discharge status: Home / Self Care | Payer: Self-pay | Source: Ambulatory Visit | Attending: Orthopedic Surgery

## 2016-08-17 ENCOUNTER — Ambulatory Visit (HOSPITAL_COMMUNITY)
Admission: RE | Admit: 2016-08-17 | Discharge: 2016-08-17 | Disposition: A | Discharge status: Home / Self Care | Payer: No Typology Code available for payment source | Source: Ambulatory Visit | Attending: Orthopedic Surgery | Admitting: Orthopedic Surgery

## 2016-08-17 DIAGNOSIS — F17200 Nicotine dependence, unspecified, uncomplicated: Secondary | ICD-10-CM | POA: Diagnosis not present

## 2016-08-17 DIAGNOSIS — W240XXA Contact with lifting devices, not elsewhere classified, initial encounter: Secondary | ICD-10-CM | POA: Insufficient documentation

## 2016-08-17 DIAGNOSIS — S62522B Displaced fracture of distal phalanx of left thumb, initial encounter for open fracture: Secondary | ICD-10-CM | POA: Insufficient documentation

## 2016-08-17 DIAGNOSIS — S62521A Displaced fracture of distal phalanx of right thumb, initial encounter for closed fracture: Secondary | ICD-10-CM

## 2016-08-17 HISTORY — PX: OPEN REDUCTION INTERNAL FIXATION (ORIF) METACARPAL: SHX6234

## 2016-08-17 LAB — CBC
HCT: 45.8 % (ref 39.0–52.0)
Hemoglobin: 15.9 g/dL (ref 13.0–17.0)
MCH: 31.2 pg (ref 26.0–34.0)
MCHC: 34.7 g/dL (ref 30.0–36.0)
MCV: 89.8 fL (ref 78.0–100.0)
Platelets: 267 10*3/uL (ref 150–400)
RBC: 5.1 MIL/uL (ref 4.22–5.81)
RDW: 12.7 % (ref 11.5–15.5)
WBC: 6.5 10*3/uL (ref 4.0–10.5)

## 2016-08-17 SURGERY — OPEN REDUCTION INTERNAL FIXATION (ORIF) METACARPAL
Anesthesia: General | Site: Thumb | Laterality: Left

## 2016-08-17 MED ORDER — PROPOFOL 10 MG/ML IV BOLUS
INTRAVENOUS | Status: DC | PRN
  Administered 2016-08-17: 30 mg via INTRAVENOUS
  Administered 2016-08-17: 40 mg via INTRAVENOUS

## 2016-08-17 MED ORDER — GLYCOPYRROLATE 0.2 MG/ML IJ SOLN
INTRAMUSCULAR | Status: DC | PRN
  Administered 2016-08-17: 0.2 mg via INTRAVENOUS

## 2016-08-17 MED ORDER — MIDAZOLAM HCL 2 MG/2ML IJ SOLN
INTRAMUSCULAR | Status: AC
  Filled 2016-08-17: qty 2

## 2016-08-17 MED ORDER — 0.9 % SODIUM CHLORIDE (POUR BTL) OPTIME
TOPICAL | Status: DC | PRN
  Administered 2016-08-17: 1000 mL

## 2016-08-17 MED ORDER — PROPOFOL 500 MG/50ML IV EMUL
INTRAVENOUS | Status: DC | PRN
  Administered 2016-08-17: 100 ug/kg/min via INTRAVENOUS

## 2016-08-17 MED ORDER — FENTANYL CITRATE (PF) 100 MCG/2ML IJ SOLN
INTRAMUSCULAR | Status: DC
Start: 2016-08-17 — End: 2016-08-17
  Filled 2016-08-17: qty 2

## 2016-08-17 MED ORDER — BUPIVACAINE HCL (PF) 0.25 % IJ SOLN
INTRAMUSCULAR | Status: AC
  Filled 2016-08-17: qty 20

## 2016-08-17 MED ORDER — FENTANYL CITRATE (PF) 250 MCG/5ML IJ SOLN
INTRAMUSCULAR | Status: AC
  Filled 2016-08-17: qty 5

## 2016-08-17 MED ORDER — LACTATED RINGERS IV SOLN
INTRAVENOUS | Status: DC | PRN
  Administered 2016-08-17 (×2): via INTRAVENOUS

## 2016-08-17 MED ORDER — LIDOCAINE HCL 1 % IJ SOLN
INTRAMUSCULAR | Status: DC | PRN
  Administered 2016-08-17: 10 mL

## 2016-08-17 MED ORDER — FENTANYL CITRATE (PF) 100 MCG/2ML IJ SOLN: 25.0000 ug | INTRAMUSCULAR | Status: DC | PRN

## 2016-08-17 MED ORDER — LIDOCAINE HCL (CARDIAC) 20 MG/ML IV SOLN
INTRAVENOUS | Status: DC | PRN
  Administered 2016-08-17: 80 mg via INTRAVENOUS

## 2016-08-17 MED ORDER — MIDAZOLAM HCL 2 MG/2ML IJ SOLN: 2.0000 mg | Freq: Once | INTRAMUSCULAR | Status: DC

## 2016-08-17 MED ORDER — FENTANYL CITRATE (PF) 100 MCG/2ML IJ SOLN: 100.0000 ug | Freq: Once | INTRAMUSCULAR | Status: DC

## 2016-08-17 MED ORDER — BUPIVACAINE HCL (PF) 0.25 % IJ SOLN
INTRAMUSCULAR | Status: DC | PRN
  Administered 2016-08-17: 10 mL via PERINEURAL

## 2016-08-17 MED ORDER — CHLORHEXIDINE GLUCONATE 4 % EX LIQD: 60.0000 mL | Freq: Once | CUTANEOUS | Status: DC

## 2016-08-17 MED ORDER — OXYCODONE-ACETAMINOPHEN 5-325 MG PO TABS: 1.0000 | ORAL_TABLET | Freq: Three times a day (TID) | ORAL | 0 refills | Status: AC

## 2016-08-17 MED ORDER — MIDAZOLAM HCL 2 MG/2ML IJ SOLN
INTRAMUSCULAR | Status: DC | PRN
  Administered 2016-08-17: 2 mg via INTRAVENOUS

## 2016-08-17 MED ORDER — CEFAZOLIN SODIUM-DEXTROSE 2-4 GM/100ML-% IV SOLN
2.0000 g | INTRAVENOUS | Status: AC
  Administered 2016-08-17: 2 g via INTRAVENOUS
  Filled 2016-08-17: qty 100

## 2016-08-17 MED ORDER — FENTANYL CITRATE (PF) 100 MCG/2ML IJ SOLN
INTRAMUSCULAR | Status: DC | PRN
  Administered 2016-08-17 (×2): 25 ug via INTRAVENOUS
  Administered 2016-08-17: 50 ug via INTRAVENOUS

## 2016-08-17 MED ORDER — CEPHALEXIN 500 MG PO CAPS: 500.0000 mg | ORAL_CAPSULE | Freq: Four times a day (QID) | ORAL | 0 refills | Status: AC

## 2016-08-17 MED ORDER — LIDOCAINE HCL (PF) 1 % IJ SOLN
INTRAMUSCULAR | Status: AC
  Filled 2016-08-17: qty 30

## 2016-08-17 MED ORDER — PROPOFOL 10 MG/ML IV BOLUS
INTRAVENOUS | Status: AC
  Filled 2016-08-17: qty 20

## 2016-08-17 MED ORDER — ONDANSETRON HCL 4 MG/2ML IJ SOLN
INTRAMUSCULAR | Status: DC | PRN
Start: 2016-08-17 — End: 2016-08-17
  Administered 2016-08-17: 4 mg via INTRAVENOUS

## 2016-08-17 MED ORDER — PROPOFOL 1000 MG/100ML IV EMUL
INTRAVENOUS | Status: AC
  Filled 2016-08-17: qty 100

## 2016-08-17 SURGICAL SUPPLY — 59 items
BANDAGE ACE 3X5.8 VEL STRL LF (GAUZE/BANDAGES/DRESSINGS) IMPLANT
BANDAGE ACE 4X5 VEL STRL LF (GAUZE/BANDAGES/DRESSINGS) IMPLANT
BLADE CLIPPER SURG (BLADE) IMPLANT
BNDG CMPR 9X4 STRL LF SNTH (GAUZE/BANDAGES/DRESSINGS) ×1
BNDG COHESIVE 1X5 TAN STRL LF (GAUZE/BANDAGES/DRESSINGS) ×3 IMPLANT
BNDG CONFORM 2 STRL LF (GAUZE/BANDAGES/DRESSINGS) ×3 IMPLANT
BNDG ESMARK 4X9 LF (GAUZE/BANDAGES/DRESSINGS) ×3 IMPLANT
BNDG GAUZE ELAST 4 BULKY (GAUZE/BANDAGES/DRESSINGS) IMPLANT
CANISTER SUCTION 2500CC (MISCELLANEOUS) ×3 IMPLANT
CLOSURE WOUND 1/2 X4 (GAUZE/BANDAGES/DRESSINGS)
CORDS BIPOLAR (ELECTRODE) ×3 IMPLANT
COVER SURGICAL LIGHT HANDLE (MISCELLANEOUS) ×3 IMPLANT
CUFF TOURNIQUET SINGLE 18IN (TOURNIQUET CUFF) ×3 IMPLANT
CUFF TOURNIQUET SINGLE 24IN (TOURNIQUET CUFF) IMPLANT
DECANTER SPIKE VIAL GLASS SM (MISCELLANEOUS) ×6 IMPLANT
DRAIN TLS ROUND 10FR (DRAIN) IMPLANT
DRAPE OEC MINIVIEW 54X84 (DRAPES) ×3 IMPLANT
DRAPE SURG 17X11 SM STRL (DRAPES) ×3 IMPLANT
DRSG ADAPTIC 3X8 NADH LF (GAUZE/BANDAGES/DRESSINGS) IMPLANT
DRSG EMULSION OIL 3X3 NADH (GAUZE/BANDAGES/DRESSINGS) ×3 IMPLANT
GAUZE SPONGE 4X4 12PLY STRL (GAUZE/BANDAGES/DRESSINGS) ×3 IMPLANT
GAUZE SPONGE 4X4 16PLY XRAY LF (GAUZE/BANDAGES/DRESSINGS) IMPLANT
GLOVE BIO SURGEON STRL SZ 6.5 (GLOVE) ×2 IMPLANT
GLOVE BIO SURGEONS STRL SZ 6.5 (GLOVE) ×1
GLOVE BIOGEL PI IND STRL 7.5 (GLOVE) ×1 IMPLANT
GLOVE BIOGEL PI IND STRL 8.5 (GLOVE) ×1 IMPLANT
GLOVE BIOGEL PI INDICATOR 7.5 (GLOVE) ×2
GLOVE BIOGEL PI INDICATOR 8.5 (GLOVE) ×2
GLOVE SURG ORTHO 8.0 STRL STRW (GLOVE) ×3 IMPLANT
GLOVE SURG SS PI 7.0 STRL IVOR (GLOVE) ×3 IMPLANT
GOWN STRL REUS W/ TWL LRG LVL3 (GOWN DISPOSABLE) ×1 IMPLANT
GOWN STRL REUS W/ TWL XL LVL3 (GOWN DISPOSABLE) ×1 IMPLANT
GOWN STRL REUS W/TWL LRG LVL3 (GOWN DISPOSABLE) ×2
GOWN STRL REUS W/TWL XL LVL3 (GOWN DISPOSABLE) ×2
KIT BASIN OR (CUSTOM PROCEDURE TRAY) ×3 IMPLANT
KIT ROOM TURNOVER OR (KITS) ×3 IMPLANT
MANIFOLD NEPTUNE II (INSTRUMENTS) IMPLANT
NEEDLE HYPO 25X1 1.5 SAFETY (NEEDLE) ×6 IMPLANT
NS IRRIG 1000ML POUR BTL (IV SOLUTION) ×3 IMPLANT
PACK ORTHO EXTREMITY (CUSTOM PROCEDURE TRAY) ×3 IMPLANT
PAD ARMBOARD 7.5X6 YLW CONV (MISCELLANEOUS) ×6 IMPLANT
PAD CAST 4YDX4 CTTN HI CHSV (CAST SUPPLIES) IMPLANT
PADDING CAST COTTON 4X4 STRL (CAST SUPPLIES)
SOAP 2 % CHG 4 OZ (WOUND CARE) ×3 IMPLANT
SPLINT FINGER W/BULB (SOFTGOODS) ×6 IMPLANT
STRIP CLOSURE SKIN 1/2X4 (GAUZE/BANDAGES/DRESSINGS) IMPLANT
SUT CHROMIC 5 0 P 3 (SUTURE) ×6 IMPLANT
SUT ETHILON 4 0 PS 2 18 (SUTURE) IMPLANT
SUT MNCRL AB 4-0 PS2 18 (SUTURE) IMPLANT
SUT VIC AB 2-0 FS1 27 (SUTURE) IMPLANT
SUT VICRYL 4-0 PS2 18IN ABS (SUTURE) IMPLANT
SYR CONTROL 10ML LL (SYRINGE) ×6 IMPLANT
SYSTEM CHEST DRAIN TLS 7FR (DRAIN) IMPLANT
TOWEL OR 17X24 6PK STRL BLUE (TOWEL DISPOSABLE) ×3 IMPLANT
TOWEL OR 17X26 10 PK STRL BLUE (TOWEL DISPOSABLE) ×3 IMPLANT
TUBE CONNECTING 12'X1/4 (SUCTIONS) ×1
TUBE CONNECTING 12X1/4 (SUCTIONS) ×2 IMPLANT
WATER STERILE IRR 1000ML POUR (IV SOLUTION) IMPLANT
YANKAUER SUCT BULB TIP NO VENT (SUCTIONS) IMPLANT

## 2016-08-17 NOTE — Transfer of Care (Signed)
Immediate Anesthesia Transfer of Care Note  Patient: David Hunter  Procedure(s) Performed: Procedure(s): OPEN THUMB DISTAL PHALANX FRACTURE (Left)  Patient Location: PACU  Anesthesia Type:MAC  Level of Consciousness: awake, alert  and oriented  Airway & Oxygen Therapy: Patient Spontanous Breathing and Patient connected to nasal cannula oxygen  Post-op Assessment: Report given to RN, Post -op Vital signs reviewed and stable and Patient moving all extremities  Post vital signs: Reviewed and stable  Last Vitals:  Vitals:   08/17/16 1315 08/17/16 1810  BP: 129/71 (!) 107/59  Pulse: 65 65  Resp: 20 16  Temp: 36.6 C 36.6 C    Last Pain:  Vitals:   08/17/16 1810  TempSrc:   PainSc: Asleep      Patients Stated Pain Goal: 2 (39/[id number])  Complications: No apparent anesthesia complications

## 2016-08-17 NOTE — Op Note (Signed)
PREOPERATIVE DIAGNOSIS: Left thumb open distal phalanx fracture  POSTOPERATIVE DIAGNOSIS: Same  ATTENDING SURGEON: Dr. Gilman SchmidtFred Hunter who was scrubbed and present for the entire procedure  ASSISTANT SURGEON: None  ANESTHESIA: Local with IV sedation  OPERATIVE PROCEDURE: 1:Open debridement left thumb distal phalanx fracture skin subcutaneous tissue and bone excisional debridement 2: Open treatment of left thumb distal phalanx fracture without internal fixation  #3: Repair left thumb nail bed #4: Radiographs 2 views left thumb  IMPLANTS: None  RADIOGRAPHIC INTERPRETATION: AP lateral views of the left thumb show the distal phalanx fracture in good position  SURGICAL INDICATIONS: [name] is a right-hand-dominant gentleman is sustained the open injury to the left thumb. [facility] and referred to the office for definitive treatment. It is recommended the patient undergo the above procedure. Risks benefits and alternatives were discussed in detail with the patient and signed informed consent was obtained. Risks include but not limited to bleeding infection damage to nearby nerves arteries or tendons loss of motion of the wrists and digits incomplete relief of symptoms and need for further surgical intervention.  SURGICAL TECHNIQUE: Patient is properly identified in the preoperative holding area and a mark with a permanent marker made a left thumb indicate correct operative site. Patient brought back to the operating room placed supine on anesthesia room table where the IV sedation was administered. On percent Xylocaine core percent Marcaine was used for local anesthetic. Patient tolerated this administration. A well-padded tourniquet was placed on the left forearm and sealed with the appropriate drape. Left upper extremities and prepped and draped in normal sterile fashion. Timeout was called cracks I was notified the procedure begun. Using a small Freer elevator  the nail plate was then elevated. Fracture site was then exposed. Open debridement of skin subcutaneous tissue all the way down the bone was then carried out with small curettes sharp scissors and a knife. The wound was irrigated. Given the fracture fragment reduced very nicely and then using 5-0 chromic suture the nail bed was repaired. This aligned the fracture nicely. Did not feel necessary for further internal fixation of an how well the nail bed and the fracture lined up. Final radiographs then obtained. Wound was thoroughly irrigated. The nail plate was placed back underneath the eponychia. An Adaptic dressing a sterile compressive bandage then applied. Patient tolerated the procedure well tourniquet was deflated with good perfusion the thumb. Patient is then placed in a small protector splint. Taken recovery room in good condition.  POSTOPERATIVE PLAN: Patient be discharged home seen back now office in 6 days for wound check x-ray back into his protective splint which he can take on and off as he works on his mobility. I plan to see the patient back in 5 weeks for final radiographs and motion assessment.

## 2016-08-17 NOTE — H&P (Signed)
  David Hunter is an 31 y.o. male.   Chief Complaint: LEFT THUMB OPEN DISTAL PHALANX FRACTURE  HPI: David Hunter IS A 30 Y/O RIGHT HAND DOMINANT MALE WHO INJURED HIS LEFT THUMB ON 08/15/16 BY HIS THUMB GETTING CAUGHT IN A HYDRAULIC LIFT GATE. HE WAS SEEN AT THE EMERGENCY DEPARTMENT WHERE THE WOUND WAS CLEANED AND A DRESSING WAS PUT ON.  HE FOLLOWED UP IN THE OFFICE FOR FURTHER EVALUATION. REVIEWED HIS RADIOGRAPHS AND DISCUSSED THE REASON AND RATIONALE FOR SURGICAL INTERVENTION.  DISCUSSED THE SURGICAL PROCEDURE, INCLUDING THE RISKS VERSUS BENEFITS, AND THE POST-OPERATIVE RECOVERY PROCESS.  THE PATIENT IS HERE TODAY FOR SURGERY.   Past Medical History:  Diagnosis Date  . Asthma     No past surgical history on file.  No family history on file. Social History:  reports that he has been smoking.  He has never used smokeless tobacco. He reports that he uses drugs, including Marijuana, about 2 times per week. He reports that he does not drink alcohol.  Allergies: No Known Allergies  No prescriptions prior to admission.    No results found for this or any previous visit (from the past 48 hour(s)). Dg Finger Thumb Left  Result Date: 08/15/2016 CLINICAL DATA:  Crush injury of the left thumb today in a hydropic press. EXAM: LEFT THUMB 2+V COMPARISON:  None in PACs FINDINGS: There is a transversely oriented tuft fracture of the distal phalanx. Alignment remains near anatomic. There is disruption of the overlying soft tissues and nail bed with soft tissue gas present. The IP joint is unremarkable. The proximal phalanx is normal. The first metacarpal appears normal. IMPRESSION: There is an open minimally distracted fracture of the tuft of the distal phalanx of the left thumb. Electronically Signed   By: David  [place] M.D.   On: 08/15/2016 15:49    ROS NO RECENT ILLNESSES OR HOSPITALIZATIONS  There were no vitals taken for this visit. Physical Exam  General Appearance:  Alert, cooperative, no  distress, appears stated age  Head:  Normocephalic, without obvious abnormality, atraumatic  Eyes:  Pupils equal, conjunctiva/corneas clear,         Throat: Lips, mucosa, and tongue normal; teeth and gums normal  Neck: No visible masses     Lungs:   respirations unlabored  Chest Wall:  No tenderness or deformity  Heart:  Regular rate and rhythm,  Abdomen:   Soft, non-tender,         Extremities:   Pulses: 2+ and symmetric  Skin: Skin color, texture, turgor normal, no rashes or lesions     Neurologic: Normal    Assessment LEFT THUMB OPEN DISTAL PHALANX FRACTURE  Plan LEFT THUMB WOUND DEBRIDEMENT WITH NAIL REPAIR AND FRACTURE REPAIR R/B/A DISCUSSED WITH David Hunter IN OFFICE.  David Hunter VOICED UNDERSTANDING OF PLAN CONSENT SIGNED DAY OF SURGERY David Hunter SEEN AND EXAMINED PRIOR TO OPERATIVE PROCEDURE/DAY OF SURGERY SITE MARKED. QUESTIONS ANSWERED WILL GO HOME FOLLOWING SURGERY  WE ARE PLANNING SURGERY FOR YOUR UPPER EXTREMITY. THE RISKS AND BENEFITS OF SURGERY INCLUDE BUT NOT LIMITED TO BLEEDING INFECTION, DAMAGE TO NEARBY NERVES ARTERIES TENDONS, FAILURE OF SURGERY TO ACCOMPLISH ITS INTENDED GOALS, PERSISTENT SYMPTOMS AND NEED FOR FURTHER SURGICAL INTERVENTION. WITH THIS IN MIND WE WILL PROCEED. I HAVE DISCUSSED WITH THE PATIENT THE PRE AND POSTOPERATIVE REGIMEN AND THE DOS AND DON'TS. David Hunter VOICED UNDERSTANDING AND INFORMED CONSENT SIGNED.  Karma GreaserSamantha Bonham Barton 08/17/2016, 11:40 AM

## 2016-08-17 NOTE — Anesthesia Preprocedure Evaluation (Addendum)
Anesthesia Evaluation  Patient identified by MRN, date of birth, ID band Patient awake    Reviewed: Allergy & Precautions, NPO status , Patient's Chart, lab work & pertinent test results  Airway Mallampati: II  TM Distance: >3 FB Neck ROM: Full    Dental  (+) Dental Advisory Given   Pulmonary asthma , Current Smoker,    breath sounds clear to auscultation       Cardiovascular negative cardio ROS   Rhythm:Regular Rate:Normal     Neuro/Psych negative neurological ROS     GI/Hepatic negative GI ROS, Neg liver ROS,   Endo/Other  negative endocrine ROS  Renal/GU negative Renal ROS     Musculoskeletal   Abdominal   Peds  Hematology negative hematology ROS (+)   Anesthesia Other Findings   Reproductive/Obstetrics                             Lab Results  Component Value Date   WBC 6.5 08/17/2016   HGB 15.9 08/17/2016   HCT 45.8 08/17/2016   MCV 89.8 08/17/2016   PLT 267 08/17/2016   Lab Results  Component Value Date   CREATININE 0.77 08/15/2014   BUN 5 (L) 08/15/2014   NA 138 08/15/2014   K 3.3 (L) 08/15/2014   CL 103 08/15/2014   CO2 25 08/15/2014    Anesthesia Physical Anesthesia Plan  ASA: I  Anesthesia Plan: MAC   Post-op Pain Management:    Induction: Intravenous  PONV Risk Score and Plan:   Airway Management Planned: Natural Airway and Simple Face Mask  Additional Equipment:   Intra-op Plan:   Post-operative Plan:   Informed Consent: I have reviewed the patients History and Physical, chart, labs and discussed the procedure including the risks, benefits and alternatives for the proposed anesthesia with the patient or authorized representative who has indicated his/her understanding and acceptance.   Dental advisory given  Plan Discussed with: CRNA  Anesthesia Plan Comments:        Anesthesia Quick Evaluation

## 2016-08-17 NOTE — Discharge Instructions (Signed)
KEEP BANDAGE CLEAN AND DRY CALL OFFICE FOR F/U APPT [phone] in 6 days KEEP HAND ELEVATED ABOVE HEART OK TO APPLY ICE TO OPERATIVE AREA CONTACT OFFICE IF ANY WORSENING PAIN OR CONCERNS.  

## 2016-08-17 NOTE — Anesthesia Postprocedure Evaluation (Signed)
Anesthesia Post Note  Patient: Mikle BosworthBrian O Toops  Procedure(s) Performed: Procedure(s) (LRB): OPEN THUMB DISTAL PHALANX FRACTURE REDUCTION AND REPAIR (Left)     Patient location during evaluation: PACU Anesthesia Type: General Level of consciousness: awake Pain management: pain level controlled Vital Signs Assessment: post-procedure vital signs reviewed and stable Respiratory status: spontaneous breathing Cardiovascular status: stable Postop Assessment: no signs of nausea or vomiting Anesthetic complications: no    Last Vitals:  Vitals:   08/17/16 1825 08/17/16 1839  BP: 111/73 118/74  Pulse: 62 62  Resp: 18 17  Temp:  36.7 C    Last Pain:  Vitals:   08/17/16 1839  TempSrc:   PainSc: 0-No pain                 Dustyn Dansereau

## 2016-08-18 ENCOUNTER — Emergency Department (HOSPITAL_COMMUNITY)
Admission: EM | Admit: 2016-08-18 | Discharge: 2016-08-18 | Disposition: A | Discharge status: Home / Self Care | Payer: Self-pay | Attending: Emergency Medicine | Admitting: Emergency Medicine

## 2016-08-18 ENCOUNTER — Encounter (HOSPITAL_COMMUNITY): Payer: Self-pay | Admitting: Emergency Medicine

## 2016-08-18 DIAGNOSIS — G8918 Other acute postprocedural pain: Secondary | ICD-10-CM | POA: Insufficient documentation

## 2016-08-18 DIAGNOSIS — F17200 Nicotine dependence, unspecified, uncomplicated: Secondary | ICD-10-CM | POA: Insufficient documentation

## 2016-08-18 MED ORDER — HYDROMORPHONE HCL 1 MG/ML IJ SOLN
1.0000 mg | Freq: Once | INTRAMUSCULAR | Status: AC
  Administered 2016-08-18: 1 mg via INTRAMUSCULAR
  Filled 2016-08-18: qty 1

## 2016-08-18 MED ORDER — TRAMADOL HCL 50 MG PO TABS
50.0000 mg | ORAL_TABLET | Freq: Once | ORAL | Status: AC
  Administered 2016-08-18: 50 mg via ORAL
  Filled 2016-08-18: qty 1

## 2016-08-18 MED ORDER — HYDROMORPHONE HCL 1 MG/ML IJ SOLN
2.0000 mg | Freq: Once | INTRAMUSCULAR | Status: AC
  Administered 2016-08-18: 2 mg via INTRAMUSCULAR
  Filled 2016-08-18: qty 2

## 2016-08-18 NOTE — ED Provider Notes (Signed)
[facility] Provider Note   CSN: [id number] Arrival date & time: 08/18/16  0325     History   Chief Complaint Chief Complaint  Patient presents with  . Hand Pain    HPI David Hunter is a 31 y.o. male.  The history is provided by the patient and a relative.  Hand Pain  This is a new problem. The current episode started 3 to 5 hours ago. The problem occurs constantly. The problem has been rapidly worsening. Associated symptoms include headaches. Pertinent negatives include no chest pain. Exacerbated by: palpation. Nothing relieves the symptoms.  pt underwent left thumb surgery yesterday evening by Dr Melvyn Novasrtmann (open debridement, open treatment of fracture) After going home and the medicine began to wear off, pt had onset of worsening pain   He took home pain meds without relief No fever/vomiting but he has HA No other acute complaints  Past Medical History:  Diagnosis Date  . Asthma     There are no active problems to display for this patient.   Past Surgical History:  Procedure Laterality Date  . WISDOM TOOTH EXTRACTION  2010       Home Medications    Prior to Admission medications   Medication Sig Start Date End Date Taking? Authorizing Provider  cephALEXin (KEFLEX) 500 MG capsule Take 1 capsule (500 mg total) by mouth 4 (four) times daily. 08/17/16 08/27/16  Bradly Bienenstockrtmann, Fred, MD  HYDROcodone-acetaminophen (NORCO/VICODIN) 5-325 MG tablet Take 1-2 tablets by mouth every 4 (four) hours as needed. 08/15/16   Deatra Canterxford, William J, FNP  oxyCODONE-acetaminophen (ROXICET) 5-325 MG tablet Take 1 tablet by mouth 3 (three) times daily. 08/17/16 08/27/16  Bradly Bienenstockrtmann, Fred, MD    Family History No family history on file.  Social History Social History  Substance Use Topics  . Smoking status: Current Some Day Smoker  . Smokeless tobacco: Never Used  . Alcohol use No     Allergies   Patient has no known allergies.   Review of Systems Review of Systems  Constitutional:  Negative for fever.  Cardiovascular: Negative for chest pain.  Musculoskeletal: Positive for arthralgias and joint swelling.  Skin: Positive for wound.  Neurological: Positive for headaches.  All other systems reviewed and are negative.    Physical Exam Updated Vital Signs BP (!) 149/99   Pulse 85   Temp 97.6 F (36.4 C)   Resp 16   Ht 1.702 m (5\' 7" )   Wt 63.5 kg (140 lb)   SpO2 100%   BMI 21.93 kg/m   Physical Exam CONSTITUTIONAL: Anxious, diaphoretic, hyperventilating HEAD: Normocephalic/atraumatic EYES: EOMI ENMT: Mucous membranes moist NECK: supple no meningeal signs CV: S1/S2 noted, no murmurs/rubs/gallops noted LUNGS: Lungs are clear to auscultation bilaterally, no apparent distress ABDOMEN: soft  NEURO: Pt is awake/alert/appropriate, moves all extremitiesx4.  EXTREMITIES: pulses normal/equal, full ROM Left thumb - wound is bandaged, no foul smell or drainaged noted  he is diffusely tender to left thumb PSYCH: anxious  ED Treatments / Results  Labs (all labs ordered are listed, but only abnormal results are displayed) Labs Reviewed - No data to display  EKG  EKG Interpretation None       Radiology No results found.  Procedures Procedures   SPLINT APPLICATION Date/Time: 6:13 AM Authorized by: Joya GaskinsWICKLINE,Mantaj Chamberlin W Consent: Verbal consent obtained. Risks and benefits: risks, benefits and alternatives were discussed Consent given by: patient Splint applied by: orthopedic technician Location details: LEFT HAND Splint type: THUMB SPICA Supplies used: ORTHO GLASS Post-procedure: The  splinted body part was neurovascularly unchanged following the procedure. Patient tolerance: Patient tolerated the procedure well with no immediate complications.     Medications Ordered in ED Medications  traMADol (ULTRAM) tablet 50 mg (50 mg Oral Given 08/18/16 0346)  HYDROmorphone (DILAUDID) injection 2 mg (2 mg Intramuscular Given 08/18/16 0432)  HYDROmorphone  (DILAUDID) injection 1 mg (1 mg Intramuscular Given 08/18/16 0603)     Initial Impression / Assessment and Plan / ED Course  I have reviewed the triage vital signs and the nursing notes.  Initial bandage was not completely removed but was loosened No active bleeding or drainage noted  At time of discharge:  PT IMPROVED HE HAD SIGNIFICANT POST OP PAIN, BUT I DOUBT ACUTE INFECTION AS SURGERY HAS BEEN ABOUT 12 HRS HE RESPONDED WELL TO PAIN MEDS HERE WE PLACED A LARGER THUMB SPICA FOR SUPPORT AND HE TOLERATED WELL HE HAS RX FOR PAIN MEDICATIONS HE WILL FILL UPON DISCHARGE  ADVISED TO CALL HIS ORTHOPEDIST IN 2 DAYS  Final Clinical Impressions(s) / ED Diagnoses   Final diagnoses:  Acute post-operative pain    New Prescriptions New Prescriptions   No medications on file     Zadie RhineWickline, Nalu Troublefield, MD 08/18/16 [phone]

## 2016-08-18 NOTE — Discharge Instructions (Signed)
PLEASE CALL DR Melvyn NovasTMANN ON Monday IF YOU HAVE FEVER ABOVE 100, VOMITING OR WORSENED PAIN THAT DOES NOT RESOLVE WITH HOME MEDS PLEASE COME TO THE ER

## 2016-08-18 NOTE — Progress Notes (Signed)
Orthopedic Tech Progress Note Patient Details:  Mikle BosworthBrian O Caraveo Mar 24, 1985 [id number]  Ortho Devices Type of Ortho Device: Thumb spica splint Splint Material: Fiberglass Ortho Device/Splint Location: Right thumb hand/  applied short arm splint thumb spica to pt right hand/thumb.  pt tolerated application very well.  provided arm sling to help support hand.  right hand.  Ortho Device/Splint Interventions: Application, Adjustment   Alvina ChouWilliams, Alec Mcphee C 08/18/2016, 5:40 AM

## 2016-08-18 NOTE — ED Notes (Addendum)
Ortho tech paged x 2

## 2016-08-18 NOTE — ED Notes (Signed)
Pt and family understood dc material. NAD noted 

## 2016-08-18 NOTE — ED Notes (Signed)
pts family came out of room searching for RN. RN went into room family stated patient is starting to sweat. RN got patient a cold washcloth for his face and an ice pack to help with his discomfort

## 2016-08-18 NOTE — ED Notes (Signed)
Pt in acute distress. RN notified provider of patients condition. MD placed orders

## 2016-08-18 NOTE — ED Triage Notes (Signed)
Pt had left thumb surgery yesterday has taken hydrocodone around 2200 and tylenol 3 around 0230. Pt is in acute pain in triage

## 2017-06-13 ENCOUNTER — Encounter (HOSPITAL_COMMUNITY): Payer: Self-pay | Admitting: Family Medicine

## 2017-06-13 ENCOUNTER — Ambulatory Visit (HOSPITAL_COMMUNITY)
Admission: EM | Admit: 2017-06-13 | Discharge: 2017-06-13 | Disposition: A | Discharge status: Home / Self Care | Payer: Self-pay | Attending: Family Medicine | Admitting: Family Medicine

## 2017-06-13 DIAGNOSIS — R112 Nausea with vomiting, unspecified: Secondary | ICD-10-CM

## 2017-06-13 DIAGNOSIS — R197 Diarrhea, unspecified: Secondary | ICD-10-CM

## 2017-06-13 MED ORDER — DICYCLOMINE HCL 20 MG PO TABS: 20.0000 mg | ORAL_TABLET | Freq: Two times a day (BID) | ORAL | 0 refills | Status: AC

## 2017-06-13 MED ORDER — ONDANSETRON 4 MG PO TBDP: 4.0000 mg | ORAL_TABLET | Freq: Three times a day (TID) | ORAL | 0 refills | Status: AC | PRN

## 2017-06-13 NOTE — ED Triage Notes (Signed)
Pt with no sig medical hx here for 3 days of lower abd pain with N,V. sts that the vomiting has stopped. The pain is intermittent and sharp. He has been drinking water and had  a few small meals.

## 2017-06-13 NOTE — Discharge Instructions (Addendum)
Zofran for nausea and vomiting as needed. Bentyl as directed. Keep hydrated, you urine should be clear to pale yellow in color. Bland diet as attached, advance as tolerated.  Monitor for any worsening of symptoms, nausea or vomiting not controlled by medication, worsening abdominal pain, fever, blood in stool/vomit, go to the emergency department for further evaluation needed.

## 2017-06-13 NOTE — ED Provider Notes (Signed)
[facility]    CSN: [id number] Arrival date & time: 06/13/17  1030     History   Chief Complaint Chief Complaint  Patient presents with  . Abdominal Pain    HPI David Hunter is a 32 y.o. male.   32 year old male comes in for 3 day history of abdominal pain. States that pain is in bilateral lower abdomen with intermittent sharp pains that lasts for about 15 mins. States pain usually starts after he feels his stomach growling, worse after eating, laying down sometimes makes it better. He had 3 episodes of nonbilious nonbloody vomit that has since resolved.  He continues with nausea.  Has 3-4 episodes of watery/loose stools on the first day as well.  Denies melena, hematochezia.  Has been drinking fluids without problems, but has only been taking small meals due to the pain and nausea.  He denies urinary symptoms such as frequency, dysuria, hematuria.   States he had some seafood prior to symptom onset, and wonders if that could have caused it.  Has had some subjective fever, took Tylenol but the first day.  He took some Pepto-Bismol without relief.  Occasional THC use, otherwise no drug use.  Occasional alcohol use, no recent use.  Denies history abdominal surgeries.      Past Medical History:  Diagnosis Date  . Asthma     There are no active problems to display for this patient.   Past Surgical History:  Procedure Laterality Date  . OPEN REDUCTION INTERNAL FIXATION (ORIF) METACARPAL Left 08/17/2016   Procedure: OPEN THUMB DISTAL PHALANX FRACTURE REDUCTION AND REPAIR;  Surgeon: Bradly Bienenstock, MD;  Location: [facility] OR;  Service: Orthopedics;  Laterality: Left;  . WISDOM TOOTH EXTRACTION  2010       Home Medications    Prior to Admission medications   Medication Sig Start Date End Date Taking? Authorizing Provider  dicyclomine (BENTYL) 20 MG tablet Take 1 tablet (20 mg total) by mouth 2 (two) times daily. 06/13/17   Cathie Hoops, Lillyonna Armstead V, PA-C  ondansetron (ZOFRAN ODT) 4 MG  disintegrating tablet Take 1 tablet (4 mg total) by mouth every 8 (eight) hours as needed for nausea or vomiting. 06/13/17   Belinda Fisher, PA-C    Family History History reviewed. No pertinent family history.  Social History Social History   Tobacco Use  . Smoking status: Current Some Day Smoker  . Smokeless tobacco: Never Used  Substance Use Topics  . Alcohol use: No  . Drug use: Yes    Frequency: 2.0 times per week    Types: Marijuana    Comment: daily use     Allergies   Patient has no known allergies.   Review of Systems Review of Systems  Reason unable to perform ROS: See HPI as above.     Physical Exam Triage Vital Signs ED Triage Vitals  Enc Vitals Group     BP 06/13/17 1118 138/83     Pulse Rate 06/13/17 1118 (!) 58     Resp 06/13/17 1118 16     Temp 06/13/17 1118 97.8 F (36.6 C)     Temp Source 06/13/17 1118 Oral     SpO2 06/13/17 1118 98 %     Weight 06/13/17 1119 140 lb (63.5 kg)     Height 06/13/17 1119  (1.702 m)     Head Circumference --      Peak Flow --      Pain Score 06/13/17 1132 4  Pain Loc --      Pain Edu? --      Excl. in GC? --    No data found.  Updated Vital Signs BP 138/83 (BP Location: Right Arm)   Pulse (!) 58   Temp 97.8 F (36.6 C) (Oral)   Resp 16   Ht  (1.702 m)   Wt 140 lb (63.5 kg)   SpO2 98%   BMI 21.93 kg/m   Physical Exam  Constitutional: He is oriented to person, place, and time. He appears well-developed and well-nourished. No distress.  HENT:  Head: Normocephalic and atraumatic.  Cardiovascular: Normal rate, regular rhythm and normal heart sounds. Exam reveals no gallop and no friction rub.  No murmur heard. Pulmonary/Chest: Effort normal and breath sounds normal. He has no wheezes. He has no rales.  Abdominal: Soft. He exhibits no mass. Bowel sounds are increased. There is generalized tenderness. There is no rigidity, no rebound, no guarding and no CVA tenderness.  Neurological: He is alert  and oriented to person, place, and time.  Skin: Skin is warm and dry.  Psychiatric: He has a normal mood and affect. His behavior is normal. Judgment normal.     UC Treatments / Results  Labs (all labs ordered are listed, but only abnormal results are displayed) Labs Reviewed - No data to display  EKG None  Radiology No results found.  Procedures Procedures (including critical care time)  Medications Ordered in UC Medications - No data to display  Initial Impression / Assessment and Plan / UC Course  I have reviewed the triage vital signs and the nursing notes.  Pertinent labs & imaging results that were available during my care of the patient were reviewed by me and considered in my medical decision making (see chart for details).    Discussed with patient no alarming signs on exam. Zofran for nausea. Bentyl as directed. Push fluids. Bland diet, advance as tolerated. Return precautions given.  Final Clinical Impressions(s) / UC Diagnoses   Final diagnoses:  Nausea vomiting and diarrhea    ED Prescriptions    Medication Sig Dispense Auth. Provider   dicyclomine (BENTYL) 20 MG tablet Take 1 tablet (20 mg total) by mouth 2 (two) times daily. 20 tablet Gabrella Stroh V, PA-C   ondansetron (ZOFRAN ODT) 4 MG disintegrating tablet Take 1 tablet (4 mg total) by mouth every 8 (eight) hours as needed for nausea or vomiting. 10 tablet Threasa Alpha, PA-C 06/13/17 1158

## 2019-06-22 ENCOUNTER — Emergency Department (HOSPITAL_COMMUNITY): Payer: No Typology Code available for payment source

## 2019-06-22 ENCOUNTER — Other Ambulatory Visit: Payer: Self-pay

## 2019-06-22 ENCOUNTER — Emergency Department (HOSPITAL_COMMUNITY)
Admission: EM | Admit: 2019-06-22 | Discharge: 2019-06-23 | Disposition: A | Discharge status: Home / Self Care | Payer: No Typology Code available for payment source | Attending: Emergency Medicine | Admitting: Emergency Medicine

## 2019-06-22 ENCOUNTER — Encounter (HOSPITAL_COMMUNITY): Payer: Self-pay

## 2019-06-22 DIAGNOSIS — Y999 Unspecified external cause status: Secondary | ICD-10-CM | POA: Insufficient documentation

## 2019-06-22 DIAGNOSIS — R220 Localized swelling, mass and lump, head: Secondary | ICD-10-CM | POA: Insufficient documentation

## 2019-06-22 DIAGNOSIS — M79642 Pain in left hand: Secondary | ICD-10-CM | POA: Insufficient documentation

## 2019-06-22 DIAGNOSIS — Z720 Tobacco use: Secondary | ICD-10-CM | POA: Insufficient documentation

## 2019-06-22 DIAGNOSIS — S0992XA Unspecified injury of nose, initial encounter: Secondary | ICD-10-CM | POA: Diagnosis present

## 2019-06-22 DIAGNOSIS — J45909 Unspecified asthma, uncomplicated: Secondary | ICD-10-CM | POA: Diagnosis not present

## 2019-06-22 DIAGNOSIS — Y92411 Interstate highway as the place of occurrence of the external cause: Secondary | ICD-10-CM | POA: Diagnosis not present

## 2019-06-22 DIAGNOSIS — J3489 Other specified disorders of nose and nasal sinuses: Secondary | ICD-10-CM | POA: Diagnosis not present

## 2019-06-22 DIAGNOSIS — S0031XA Abrasion of nose, initial encounter: Secondary | ICD-10-CM | POA: Insufficient documentation

## 2019-06-22 DIAGNOSIS — Y9389 Activity, other specified: Secondary | ICD-10-CM | POA: Insufficient documentation

## 2019-06-22 DIAGNOSIS — M545 Low back pain: Secondary | ICD-10-CM | POA: Diagnosis not present
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
## 2019-06-23 ENCOUNTER — Encounter (HOSPITAL_COMMUNITY): Payer: Self-pay | Admitting: Emergency Medicine

## 2019-06-23 ENCOUNTER — Emergency Department (HOSPITAL_COMMUNITY)
Admission: EM | Admit: 2019-06-23 | Discharge: 2019-06-23 | Disposition: A | Discharge status: Home / Self Care | Payer: No Typology Code available for payment source | Source: Home / Self Care | Attending: Emergency Medicine | Admitting: Emergency Medicine

## 2019-06-23 DIAGNOSIS — S6000XA Contusion of unspecified finger without damage to nail, initial encounter: Secondary | ICD-10-CM

## 2019-06-23 MED ORDER — DICLOFENAC SODIUM 75 MG PO TBEC: 75.0000 mg | DELAYED_RELEASE_TABLET | Freq: Two times a day (BID) | ORAL | 0 refills | Status: AC

## 2019-06-23 MED ORDER — METHOCARBAMOL 500 MG PO TABS: 500.0000 mg | ORAL_TABLET | Freq: Two times a day (BID) | ORAL | 0 refills | Status: AC
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
## 2019-06-23 NOTE — ED Notes (Signed)
Pt caled for vitals x3. No answer.

## 2019-06-23 NOTE — Discharge Instructions (Addendum)
Return if any problems.  Follow up with your Physician for recheck  
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
## 2023-09-28 ENCOUNTER — Ambulatory Visit (HOSPITAL_COMMUNITY)
Admission: EM | Admit: 2023-09-28 | Discharge: 2023-09-28 | Disposition: A | Discharge status: Home / Self Care | Payer: Self-pay | Attending: Emergency Medicine | Admitting: Emergency Medicine

## 2023-09-28 ENCOUNTER — Encounter (HOSPITAL_COMMUNITY): Payer: Self-pay | Admitting: Emergency Medicine

## 2023-09-28 DIAGNOSIS — R350 Frequency of micturition: Secondary | ICD-10-CM | POA: Insufficient documentation

## 2023-09-28 DIAGNOSIS — R300 Dysuria: Secondary | ICD-10-CM | POA: Insufficient documentation

## 2023-09-28 DIAGNOSIS — Z113 Encounter for screening for infections with a predominantly sexual mode of transmission: Secondary | ICD-10-CM | POA: Insufficient documentation

## 2023-09-28 LAB — POCT URINALYSIS DIP (MANUAL ENTRY)
Bilirubin, UA: NEGATIVE
Blood, UA: NEGATIVE
Glucose, UA: NEGATIVE mg/dL
Ketones, POC UA: NEGATIVE mg/dL
Leukocytes, UA: NEGATIVE
Nitrite, UA: NEGATIVE
Protein Ur, POC: NEGATIVE mg/dL
Spec Grav, UA: 1.02 (ref 1.010–1.025)
Urobilinogen, UA: 0.2 U/dL
pH, UA: 7.5 (ref 5.0–8.0)

## 2023-09-28 LAB — HIV ANTIBODY (ROUTINE TESTING W REFLEX): HIV Screen 4th Generation wRfx: NONREACTIVE

## 2023-09-28 NOTE — ED Triage Notes (Signed)
 Pt reports that having urinary frequency and intermittent stinging. Denies any discharge, bumps, lesions, sore or known STD exposure.

## 2023-09-28 NOTE — ED Provider Notes (Signed)
 [facility]    CSN: [id number] Arrival date & time: 09/28/23  1059      History   Chief Complaint Chief Complaint  Patient presents with   Urinary Frequency    HPI David Hunter is a 38 y.o. male.   Patient presents with urinary frequency and intermittent dysuria x 2 weeks.  Patient denies any penile discharge, penile/testicular pain or swelling, genital lesions, hematuria, abdominal pain, flank pain, and fever. Patient reports that he is sexually active.  Patient denies any known exposures to STDs.  The history is provided by the patient and medical records.  Urinary Frequency    Past Medical History:  Diagnosis Date   Asthma     There are no active problems to display for this patient.   Past Surgical History:  Procedure Laterality Date   OPEN REDUCTION INTERNAL FIXATION (ORIF) METACARPAL Left 08/17/2016   Procedure: OPEN THUMB DISTAL PHALANX FRACTURE REDUCTION AND REPAIR;  Surgeon: Shari Easter, MD;  Location: [facility] OR;  Service: Orthopedics;  Laterality: Left;   WISDOM TOOTH EXTRACTION  2010       Home Medications    Prior to Admission medications   Medication Sig Start Date End Date Taking? Authorizing Provider  diclofenac  (VOLTAREN ) 75 MG EC tablet Take 1 tablet (75 mg total) by mouth 2 (two) times daily. 06/23/19   Sofia, Leslie K, PA-C  dicyclomine  (BENTYL ) 20 MG tablet Take 1 tablet (20 mg total) by mouth 2 (two) times daily. 06/13/17   Babara, Amy V, PA-C  methocarbamol  (ROBAXIN ) 500 MG tablet Take 1 tablet (500 mg total) by mouth 2 (two) times daily. 06/23/19   Sofia, Leslie K, PA-C  ondansetron  (ZOFRAN  ODT) 4 MG disintegrating tablet Take 1 tablet (4 mg total) by mouth every 8 (eight) hours as needed for nausea or vomiting. 06/13/17   Babara Greig GAILS, PA-C    Family History No family history on file.  Social History Social History   Tobacco Use   Smoking status: Some Days   Smokeless tobacco: Never  Substance Use Topics   Alcohol use: No   Drug  use: Yes    Frequency: 2.0 times per week    Types: Marijuana    Comment: daily use     Allergies   Patient has no known allergies.   Review of Systems Review of Systems  Genitourinary:  Positive for frequency.   Per HPI  Physical Exam Triage Vital Signs ED Triage Vitals [09/28/23 1121]  Encounter Vitals Group     BP 137/88     Girls Systolic BP Percentile      Girls Diastolic BP Percentile      Boys Systolic BP Percentile      Boys Diastolic BP Percentile      Pulse Rate 72     Resp 15     Temp 98 F (36.7 C)     Temp Source Oral     SpO2 98 %     Weight      Height      Head Circumference      Peak Flow      Pain Score 0     Pain Loc      Pain Education      Exclude from Growth Chart    No data found.  Updated Vital Signs BP 137/88 (BP Location: Left Arm)   Pulse 72   Temp 98 F (36.7 C) (Oral)   Resp 15   SpO2 98%  Visual Acuity Right Eye Distance:   Left Eye Distance:   Bilateral Distance:    Right Eye Near:   Left Eye Near:    Bilateral Near:     Physical Exam Vitals and nursing note reviewed.  Constitutional:      General: He is awake. He is not in acute distress.    Appearance: Normal appearance. He is well-developed and well-groomed. He is not ill-appearing.  Abdominal:     General: Abdomen is flat. Bowel sounds are normal. There is no distension.     Palpations: Abdomen is soft.     Tenderness: There is no abdominal tenderness. There is no right CVA tenderness, left CVA tenderness or guarding.  Genitourinary:    Comments: Exam deferred Skin:    General: Skin is warm and dry.  Neurological:     Mental Status: He is alert.  Psychiatric:        Behavior: Behavior is cooperative.      UC Treatments / Results  Labs (all labs ordered are listed, but only abnormal results are displayed) Labs Reviewed  POCT URINALYSIS DIP (MANUAL ENTRY) - Abnormal; Notable for the following components:      Result Value   Clarity, UA turbid (*)     All other components within normal limits  URINE CULTURE  HIV ANTIBODY (ROUTINE TESTING W REFLEX)  RPR  CYTOLOGY, (ORAL, ANAL, URETHRAL) ANCILLARY ONLY    EKG   Radiology No results found.  Procedures Procedures (including critical care time)  Medications Ordered in UC Medications - No data to display  Initial Impression / Assessment and Plan / UC Course  I have reviewed the triage vital signs and the nursing notes.  Pertinent labs & imaging results that were available during my care of the patient were reviewed by me and considered in my medical decision making (see chart for details).     Patient is overall well-appearing.  Vitals are stable.  Urinalysis unremarkable, will send urine culture to confirm.  GU exam deferred.  Patient perform self swab for STD.  HIV and RPR ordered.  Discussed follow-up and return precautions. Final Clinical Impressions(s) / UC Diagnoses   Final diagnoses:  Urinary frequency  Dysuria  Screen for STD (sexually transmitted disease)     Discharge Instructions      Your swab, urine culture, and blood work results will return over the next few days and someone will call if results are positive and require any treatment.   ED Prescriptions   None    PDMP not reviewed this encounter.   Johnie Flaming A, NP 09/28/23 [phone]

## 2023-09-28 NOTE — Discharge Instructions (Signed)
 Your swab, urine culture, and blood work results will return over the next few days and someone will call if results are positive and require any treatment.

## 2023-09-29 LAB — URINE CULTURE: Culture: NO GROWTH

## 2023-09-29 LAB — RPR: RPR Ser Ql: NONREACTIVE

## 2023-09-30 LAB — CYTOLOGY, (ORAL, ANAL, URETHRAL) ANCILLARY ONLY
Chlamydia: NEGATIVE
Comment: NEGATIVE
Comment: NEGATIVE
Comment: NORMAL
Neisseria Gonorrhea: NEGATIVE
Trichomonas: NEGATIVE
